# Patient Record
Sex: Male | Born: 2000 | Race: White | Hispanic: No | Marital: Single | Smoking: Never smoker
Health system: Southern US, Community
[De-identification: ages and names within clinical notes are randomized; demographics above are authoritative.]

---
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
## 2024-03-08 ENCOUNTER — Emergency Department (HOSPITAL_BASED_OUTPATIENT_CLINIC_OR_DEPARTMENT_OTHER): Payer: Self-pay | Admitting: Radiology

## 2024-03-08 ENCOUNTER — Emergency Department (HOSPITAL_BASED_OUTPATIENT_CLINIC_OR_DEPARTMENT_OTHER)
Admission: EM | Admit: 2024-03-08 | Discharge: 2024-03-08 | Disposition: A | Discharge status: Home / Self Care | Payer: Self-pay | Attending: Emergency Medicine | Admitting: Emergency Medicine

## 2024-03-08 ENCOUNTER — Other Ambulatory Visit: Payer: Self-pay

## 2024-03-08 DIAGNOSIS — R0789 Other chest pain: Secondary | ICD-10-CM | POA: Insufficient documentation

## 2024-03-08 LAB — BASIC METABOLIC PANEL WITH GFR
Anion gap: 15 (ref 5–15)
BUN: 8 mg/dL (ref 6–20)
CO2: 24 mmol/L (ref 22–32)
Calcium: 10.3 mg/dL (ref 8.9–10.3)
Chloride: 102 mmol/L (ref 98–111)
Creatinine, Ser: 0.91 mg/dL (ref 0.61–1.24)
GFR, Estimated: >60 mL/min (ref >60)
Glucose, Bld: 116 mg/dL — ABNORMAL HIGH (ref 70–99)
Potassium: 3.6 mmol/L (ref 3.5–5.1)
Sodium: 140 mmol/L (ref 135–145)

## 2024-03-08 LAB — CBC
HCT: 44.2 % (ref 39.0–52.0)
Hemoglobin: 15.8 g/dL (ref 13.0–17.0)
MCH: 30.8 pg (ref 26.0–34.0)
MCHC: 35.7 g/dL (ref 30.0–36.0)
MCV: 86.2 fL (ref 80.0–100.0)
Platelets: 262 K/uL (ref 150–400)
RBC: 5.13 MIL/uL (ref 4.22–5.81)
RDW: 12.4 % (ref 11.5–15.5)
WBC: 7.6 K/uL (ref 4.0–10.5)
nRBC: 0 % (ref 0.0–0.2)

## 2024-03-08 LAB — TROPONIN T, HIGH SENSITIVITY: Troponin T High Sensitivity: <15 ng/L (ref 0–19)

## 2024-03-08 LAB — D-DIMER, QUANTITATIVE: D-Dimer, Quant: 0.32 ug{FEU}/mL (ref 0.00–0.50)

## 2024-03-08 MED ORDER — HYDROXYZINE HCL 25 MG PO TABS: 25.0000 mg | ORAL_TABLET | Freq: Four times a day (QID) | ORAL | 0 refills | Status: AC | PRN

## 2024-03-08 MED ORDER — HYDROXYZINE HCL 25 MG PO TABS
25.0000 mg | ORAL_TABLET | Freq: Once | ORAL | Status: AC
  Administered 2024-03-08: 25 mg via ORAL
  Filled 2024-03-08: qty 1

## 2024-03-08 NOTE — Discharge Instructions (Addendum)
 Your workup today is reassuring. It is very unlikely that your pain is due to an issue in your heart.  Your cardiac enzyme (troponin) was normal today. Your EKG which is a measure of the heart's electrical activity and rhythm is normal today. These would both show abnormalities if you were having a heart attack.  No signs of a blood clot in your lungs. Your blood counts electrolytes are normal today.  Your chest x-ray is normal today.  You have been prescribed a medication called hydroxyzine to help with occasional episodes of anxiety.  You may take 1 tablet up to every 6 hours as needed for anxiety.  You were given your first dose here today.  You may use 2 puffs of fluticasone (Flonase) in each nostril daily to help with congestion.  This is an over-the-counter medication.  Please follow-up with your PCP if symptoms are not staying to improve within the next 5 days.  Return to the ER if you have any shortness of breath, difficulty breathing, worsening chest pain, dizziness, jaw pain, left arm or shoulder pain, abdominal pain, unexplained fever, any other new or concerning symptoms.

## 2024-03-08 NOTE — ED Provider Notes (Signed)
 [place] EMERGENCY DEPARTMENT AT [facility] Provider Note   CSN: [id number] Arrival date & time: 03/08/24  1609     Patient presents with: Chest Pain   Andrew Martinez is a 23 y.o. male with no significant past medical history who presents with concern for intermittent episodes of chest pain ongoing for the past 3 days.  Reports his pain seems to be in the center of his chest in the last couple seconds.  This pain is nonexertional, nonpleuritic.  Does not radiate to the back.  He reports he has been anxious about a sinus infection he has had for the past 3 days and this seems to make the chest pain flare when he is worried.  He reports that during these episodes of being worried, his heart rate will also increase to about 90 to a little over 100.  He denies any shortness of breath.  Denies any lower extremity pain or swelling.  He denies any other symptoms such as cough, fever, chills, or sore throat. He reports covid and flu testing at urgent care earlier today were negative.    Chest Pain      Prior to Admission medications   Medication Sig Start Date End Date Taking? Authorizing Provider  hydrOXYzine (ATARAX) 25 MG tablet Take 1 tablet (25 mg total) by mouth every 6 (six) hours as needed for anxiety. 03/08/24  Yes Veta Palma, PA-C  acetaminophen (TYLENOL) 500 MG tablet Take 500 mg by mouth every 6 (six) hours as needed (For pain.).    [provider]  dexmethylphenidate (FOCALIN) 10 MG tablet Take 10 mg by mouth daily as needed (He takes during the school year to help him focus.).    [provider]  naproxen sodium (ALEVE) 220 MG tablet Take 220 mg by mouth every 8 (eight) hours as needed (For pain.).    [provider]    Allergies: Patient has no known allergies.    Review of Systems  Cardiovascular:  Positive for chest pain.    Updated Vital Signs BP (!) 146/89 (BP Location: Right Arm)   Pulse 85   Temp (!) 97.5 F (36.4 C)    Resp 16   Ht 6' 2 (1.88 m)   Wt 63.5 kg   SpO2 99%   BMI 17.97 kg/m   Physical Exam Vitals and nursing note reviewed.  Constitutional:      General: He is not in acute distress.    Appearance: He is well-developed.     Comments: Anxious appearing, no acute distress  HENT:     Head: Normocephalic and atraumatic.     Nose: Congestion present.  Eyes:     Extraocular Movements: Extraocular movements intact.     Conjunctiva/sclera: Conjunctivae normal.     Pupils: Pupils are equal, round, and reactive to light.  Cardiovascular:     Rate and Rhythm: Normal rate and regular rhythm.     Heart sounds: No murmur heard. Pulmonary:     Effort: Pulmonary effort is normal. No respiratory distress.     Breath sounds: Normal breath sounds.  Abdominal:     Palpations: Abdomen is soft.     Tenderness: There is no abdominal tenderness.  Musculoskeletal:        General: No swelling.     Cervical back: Neck supple.     Right lower leg: No edema.     Left lower leg: No edema.  Skin:    General: Skin is warm and dry.  Capillary Refill: Capillary refill takes less than 2 seconds.  Neurological:     Mental Status: He is alert.  Psychiatric:        Mood and Affect: Mood normal.     (all labs ordered are listed, but only abnormal results are displayed) Labs Reviewed  BASIC METABOLIC PANEL WITH GFR - Abnormal; Notable for the following components:      Result Value   Glucose, Bld 116 (*)    All other components within normal limits  CBC  D-DIMER, QUANTITATIVE  TROPONIN T, HIGH SENSITIVITY    EKG: EKG Interpretation Date/Time:  Friday March 08 2024 16:22:13 EDT Ventricular Rate:  120 PR Interval:  147 QRS Duration:  97 QT Interval:  323 QTC Calculation: 457 R Axis:   104  Text Interpretation: Sinus tachycardia RSR' in V1 or V2, probably normal variant Lateral leads are also involved Probable RV involvement, suggest recording right precordial leads Baseline wander in lead(s)  V3 Partial missing lead(s): V3 No previous tracing Confirmed by Doretha Folks ([id number]) on 03/08/2024 5:07:31 PM  Radiology: DG Chest 2 View Result Date: 03/08/2024 EXAM: 2 VIEW(S) XRAY OF THE CHEST 03/08/2024 04:47:00 PM COMPARISON: None available. CLINICAL HISTORY: Chest pain and headache for 3 days, seen at [facility], advised to come to ED due to abnormal EKG. FINDINGS: LUNGS AND PLEURA: No focal pulmonary opacity. No pulmonary edema. No pleural effusion. No pneumothorax. HEART AND MEDIASTINUM: No acute abnormality of the cardiac and mediastinal silhouettes. BONES AND SOFT TISSUES: No acute osseous abnormality. IMPRESSION: 1. No acute process. Electronically signed by: Lynwood Seip MD 03/08/2024 04:49 PM EDT RP Workstation: [id number]     Procedures   Medications Ordered in the ED  hydrOXYzine (ATARAX) tablet 25 mg (25 mg Oral Given 03/08/24 1751)                                    Medical Decision Making Amount and/or Complexity of Data Reviewed Labs: ordered. Radiology: ordered.  Risk Prescription drug management.     Differential diagnosis includes but is not limited to ACS, arrhythmia, aortic aneurysm, pericarditis, myocarditis, pericardial effusion, cardiac tamponade, musculoskeletal pain, GERD, Boerhaave's syndrome, DVT/PE, pneumonia, pleural effusion, anxiety,    ED Course:  Upon initial evaluation, patient is slightly anxious appearing, but in no acute distress.  Stable vitals.  Regular rate and rhythm on cardiac auscultation.  No murmurs appreciated.  Lungs clear to auscultation bilaterally.  No lower extremity edema or calf tenderness to palpation bilaterally.  Mild congestion noted on exam.   Labs Ordered: I Ordered, and personally interpreted labs.  The pertinent results include:   CBC within normal limits BMP within normal limits Troponin within normal limits D-dimer within normal limits  Imaging Studies ordered: I ordered imaging studies including chest  x-ray I independently visualized the imaging with scope of interpretation limited to determining acute life threatening conditions related to emergency care. Imaging showed no acute abnormality I agree with the radiologist interpretation   Cardiac Monitoring: / EKG: The patient was maintained on a cardiac monitor.  I personally viewed and interpreted the cardiac monitored which showed an underlying rhythm of: Normal sinus rhythm. Some ST elevations, but this was reviewed by my attending and this is thought to be early repolarization, not acute infarct    Medications Given: Hydroxyzine  Upon re-evaluation, patient reports he is feeling much calmer now and remains without any chest pain here in the  emergency room.   Low concern for ACS at this time given troponin less than 15, pain intermittent for 3 days, pain non-exertional, and EKG with normal sinus rhythm and no ST changes.  Chest x-ray without any acute abnormality. Low concern for DVT or PE at this time given d-dimer within normal limits.  Low concern for a myocarditis or pericarditis given chest pain not consistent, nonpositional.  Question if there could be component of anxiety that had contributed to his intermittent chest pain.  Feel he is stable and appropriate for discharge home. Reviewed case with my attending Dr. Doretha who agrees with plan of care  Impression: Atypical chest pain  Disposition:  The patient was discharged home with instructions to follow-up with PCP for recheck of symptoms in the next 5 days.  May use hydroxyzine as needed for anxiety.  Flonase as needed for nasal congestion. Return precautions given.    This chart was dictated using voice recognition software, Dragon. Despite the best efforts of this provider to proofread and correct errors, errors may still occur which can change documentation meaning.       Final diagnoses:  Atypical chest pain    ED Discharge Orders          Ordered    hydrOXYzine  (ATARAX) 25 MG tablet  Every 6 hours PRN        03/08/24 1840               Veta Palma, PA-C 03/08/24 1846    Doretha Folks, MD 03/11/24 2324

## 2024-03-08 NOTE — ED Triage Notes (Signed)
 Pt POV reporting chest pain and headache x3 days, seen at [facility], advised to come to ED due to abnormal EKG.
